# Patient Record
Sex: Female | Born: 1960 | Race: Black or African American | Hispanic: No | Marital: Single | Smoking: Former smoker
Health system: Southern US, Community
[De-identification: ages and names within clinical notes are randomized; demographics above are authoritative.]

## PROBLEM LIST (undated history)

## (undated) DIAGNOSIS — I10 Essential (primary) hypertension: Secondary | ICD-10-CM

## (undated) DIAGNOSIS — F329 Major depressive disorder, single episode, unspecified: Secondary | ICD-10-CM

## (undated) DIAGNOSIS — F32A Depression, unspecified: Secondary | ICD-10-CM

## (undated) DIAGNOSIS — I2510 Atherosclerotic heart disease of native coronary artery without angina pectoris: Secondary | ICD-10-CM

## (undated) DIAGNOSIS — J449 Chronic obstructive pulmonary disease, unspecified: Secondary | ICD-10-CM

## (undated) DIAGNOSIS — J45909 Unspecified asthma, uncomplicated: Secondary | ICD-10-CM

## (undated) DIAGNOSIS — I509 Heart failure, unspecified: Secondary | ICD-10-CM

## (undated) DIAGNOSIS — I208 Other forms of angina pectoris: Secondary | ICD-10-CM

## (undated) DIAGNOSIS — C801 Malignant (primary) neoplasm, unspecified: Secondary | ICD-10-CM

## (undated) HISTORY — PX: COLONOSCOPY: SHX174

## (undated) HISTORY — PX: CHOLECYSTECTOMY: SHX55

---

## 2006-05-22 ENCOUNTER — Emergency Department (HOSPITAL_COMMUNITY): Admission: EM | Admit: 2006-05-22 | Discharge: 2006-05-22 | Payer: Self-pay | Admitting: Emergency Medicine

## 2007-11-01 ENCOUNTER — Emergency Department (HOSPITAL_COMMUNITY): Admission: EM | Admit: 2007-11-01 | Discharge: 2007-11-01 | Payer: Self-pay | Admitting: Family Medicine

## 2007-11-17 ENCOUNTER — Emergency Department (HOSPITAL_COMMUNITY): Admission: EM | Admit: 2007-11-17 | Discharge: 2007-11-17 | Payer: Self-pay | Admitting: Emergency Medicine

## 2008-01-28 ENCOUNTER — Ambulatory Visit: Payer: Self-pay | Admitting: Internal Medicine

## 2008-02-11 ENCOUNTER — Emergency Department (HOSPITAL_COMMUNITY): Admission: EM | Admit: 2008-02-11 | Discharge: 2008-02-11 | Payer: Self-pay | Admitting: Emergency Medicine

## 2008-02-18 ENCOUNTER — Emergency Department (HOSPITAL_COMMUNITY): Admission: EM | Admit: 2008-02-18 | Discharge: 2008-02-19 | Payer: Self-pay | Admitting: Pulmonary Disease

## 2008-03-16 ENCOUNTER — Ambulatory Visit: Payer: Self-pay | Admitting: Internal Medicine

## 2008-03-16 LAB — CONVERTED CEMR LAB
ALT: 33 units/L (ref 0–35)
CO2: 22 meq/L (ref 19–32)
Calcium: 9.5 mg/dL (ref 8.4–10.5)
Chloride: 105 meq/L (ref 96–112)
Creatinine, Ser: 0.52 mg/dL (ref 0.40–1.20)
Glucose, Bld: 99 mg/dL (ref 70–99)
Total Protein: 6 g/dL (ref 6.0–8.3)

## 2008-03-28 ENCOUNTER — Ambulatory Visit: Payer: Self-pay | Admitting: *Deleted

## 2008-05-05 ENCOUNTER — Encounter: Payer: Self-pay | Admitting: Obstetrics and Gynecology

## 2008-05-05 ENCOUNTER — Other Ambulatory Visit: Admission: RE | Admit: 2008-05-05 | Discharge: 2008-05-05 | Payer: Self-pay | Admitting: Obstetrics and Gynecology

## 2008-05-05 ENCOUNTER — Ambulatory Visit: Payer: Self-pay | Admitting: Obstetrics and Gynecology

## 2008-05-18 ENCOUNTER — Ambulatory Visit (HOSPITAL_COMMUNITY): Admission: RE | Admit: 2008-05-18 | Discharge: 2008-05-18 | Payer: Self-pay | Admitting: Family Medicine

## 2008-05-19 ENCOUNTER — Ambulatory Visit: Payer: Self-pay | Admitting: Obstetrics and Gynecology

## 2008-05-20 ENCOUNTER — Encounter: Admission: RE | Admit: 2008-05-20 | Discharge: 2008-05-20 | Payer: Self-pay | Admitting: Obstetrics and Gynecology

## 2008-06-16 ENCOUNTER — Encounter: Payer: Self-pay | Admitting: Obstetrics & Gynecology

## 2008-06-16 ENCOUNTER — Ambulatory Visit: Payer: Self-pay | Admitting: Obstetrics & Gynecology

## 2008-06-16 ENCOUNTER — Ambulatory Visit (HOSPITAL_COMMUNITY): Admission: RE | Admit: 2008-06-16 | Discharge: 2008-06-16 | Payer: Self-pay | Admitting: Obstetrics & Gynecology

## 2008-07-21 ENCOUNTER — Ambulatory Visit: Payer: Self-pay | Admitting: Obstetrics and Gynecology

## 2008-09-20 ENCOUNTER — Ambulatory Visit: Payer: Self-pay | Admitting: Family Medicine

## 2008-09-20 LAB — CONVERTED CEMR LAB
ALT: 21 units/L (ref 0–35)
Basophils Absolute: 0 10*3/uL (ref 0.0–0.1)
CO2: 19 meq/L (ref 19–32)
Calcium: 8.5 mg/dL (ref 8.4–10.5)
Chloride: 107 meq/L (ref 96–112)
Hemoglobin: 15.2 g/dL — ABNORMAL HIGH (ref 12.0–15.0)
Lymphocytes Relative: 38 % (ref 12–46)
Neutro Abs: 6.2 10*3/uL (ref 1.7–7.7)
Platelets: 270 10*3/uL (ref 150–400)
RDW: 12.7 % (ref 11.5–15.5)
Sodium: 137 meq/L (ref 135–145)
TSH: 1.841 microintl units/mL (ref 0.350–4.50)
Total Protein: 5.8 g/dL — ABNORMAL LOW (ref 6.0–8.3)
Vit D, 1,25-Dihydroxy: 14 — ABNORMAL LOW (ref 30–89)

## 2008-11-16 ENCOUNTER — Encounter: Admission: RE | Admit: 2008-11-16 | Discharge: 2008-11-16 | Payer: Self-pay | Admitting: Family Medicine

## 2008-12-19 ENCOUNTER — Ambulatory Visit: Payer: Self-pay | Admitting: Family Medicine

## 2008-12-21 ENCOUNTER — Ambulatory Visit: Payer: Self-pay | Admitting: Internal Medicine

## 2010-05-06 IMAGING — CT CT ABDOMEN W/ CM
2 of 5 series · 17 of 46 positions shown, 19 images · IV contrast (APPLIED)
Comparison: None

CT ABDOMEN

CLINICAL DATA: Left hip and groin pain, lower abdominal pain,
nausea.

CT ABDOMEN AND PELVIS WITH CONTRAST
TECHNIQUE: Multidetector CT imaging of the abdomen and pelvis was
performed using the standard protocol following bolus
administration of intravenous contrast.
Contrast: 80 ml Umnipaque-IPP

[Series 2: abd/pelv with 5.0 b31f st · axial · 0.79mm/px · z∈[-483,-93]mm · 14 of 88 slices shown, 16 images]
[im 5/88  soft-tissue]
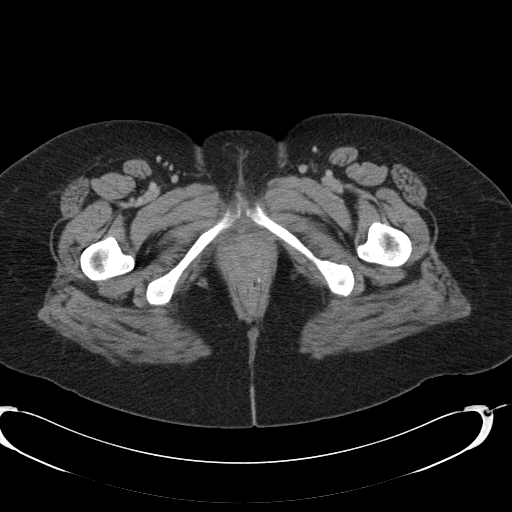
[im 5/88  bone]
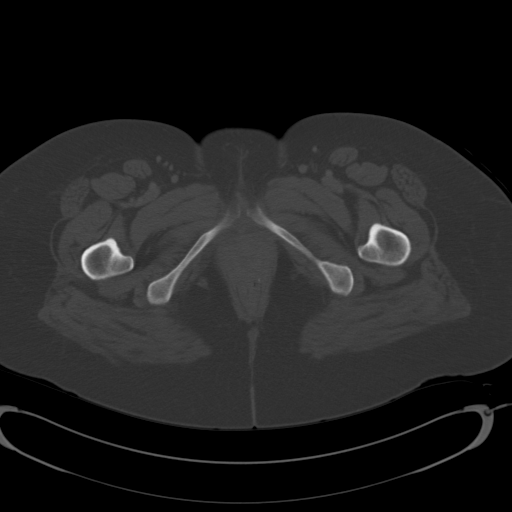
[im 10/88  soft-tissue]
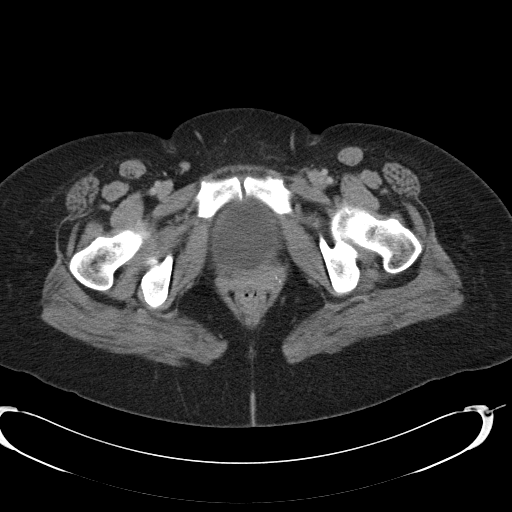
[im 19/88  soft-tissue]
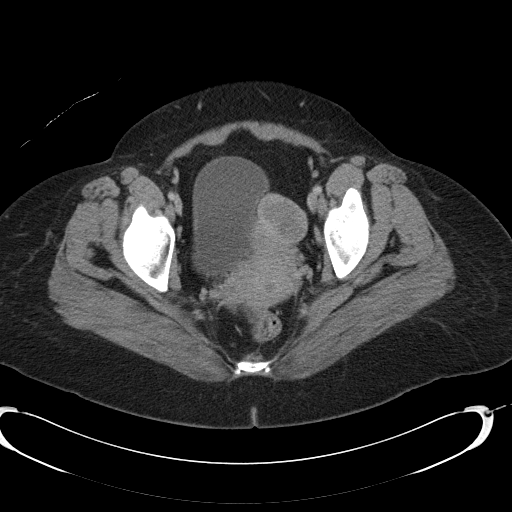
[im 23/88  soft-tissue]
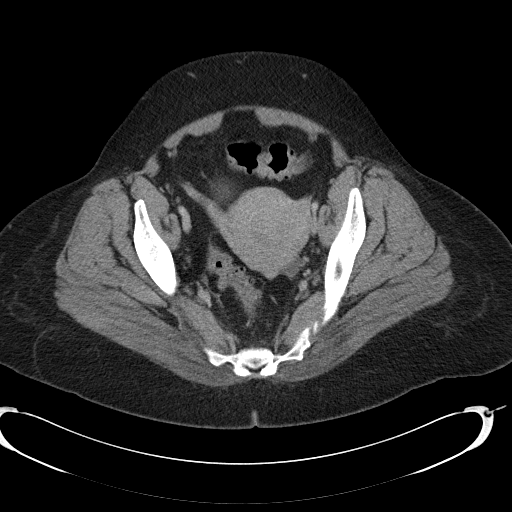
[im 28/88  soft-tissue]
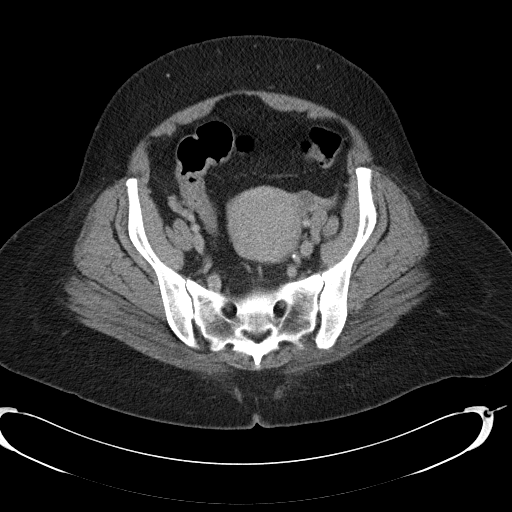
[im 37/88  soft-tissue]
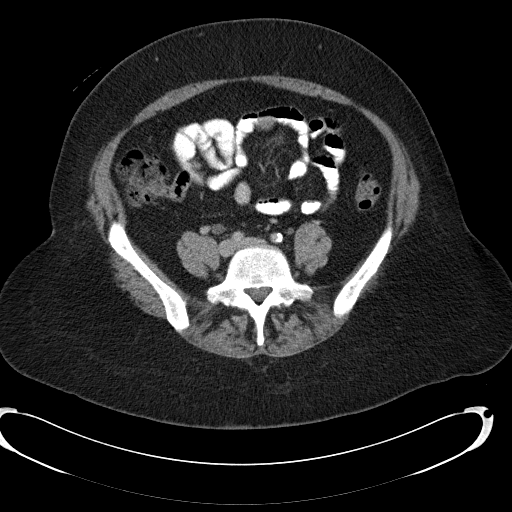
[im 42/88  soft-tissue]
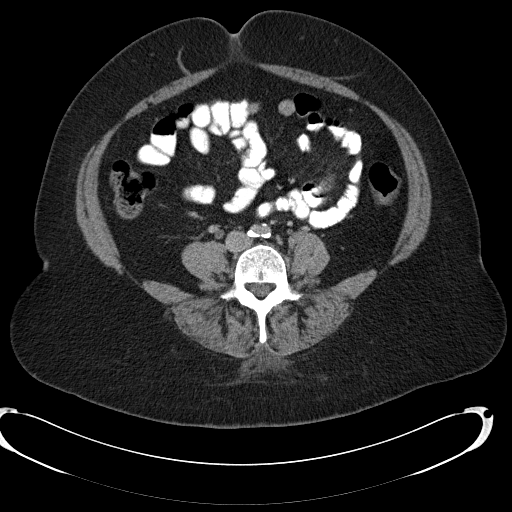
[im 46/88  soft-tissue]
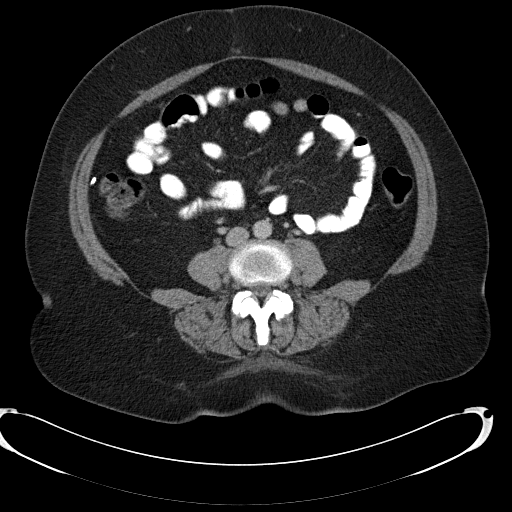
[im 51/88  soft-tissue]
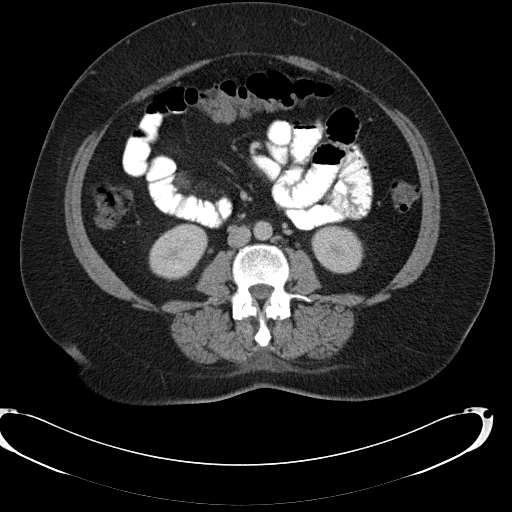
[im 51/88  bone]
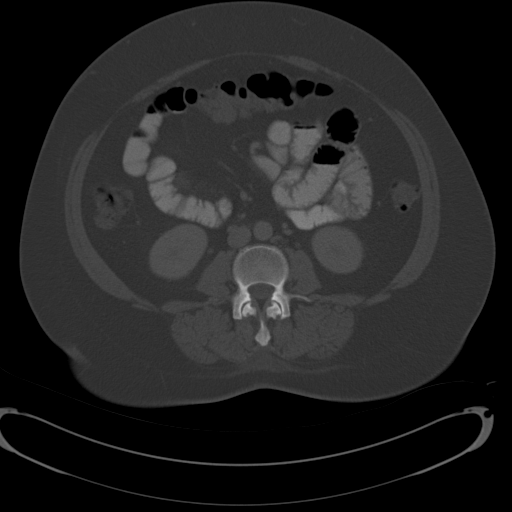
[im 60/88  soft-tissue]
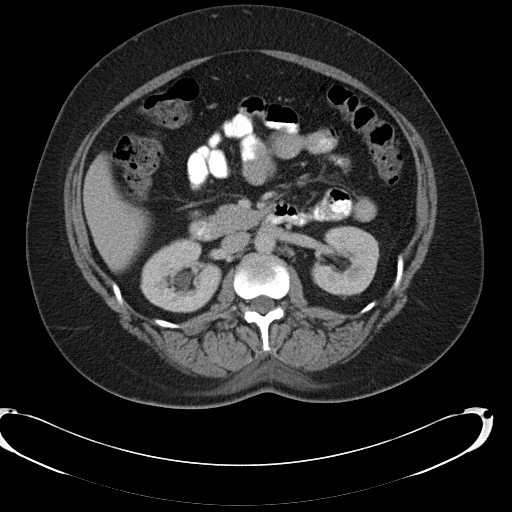
[im 65/88  soft-tissue]
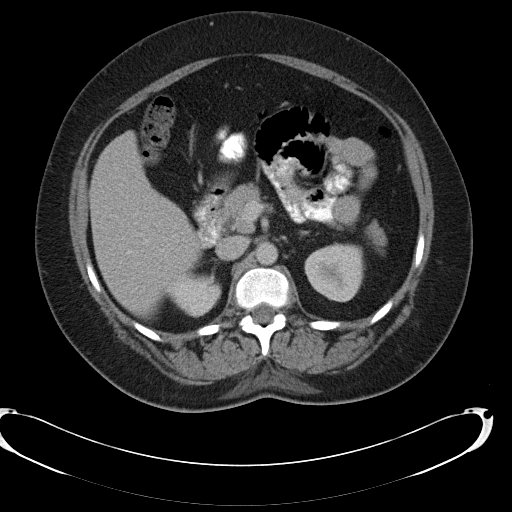
[im 69/88  soft-tissue]
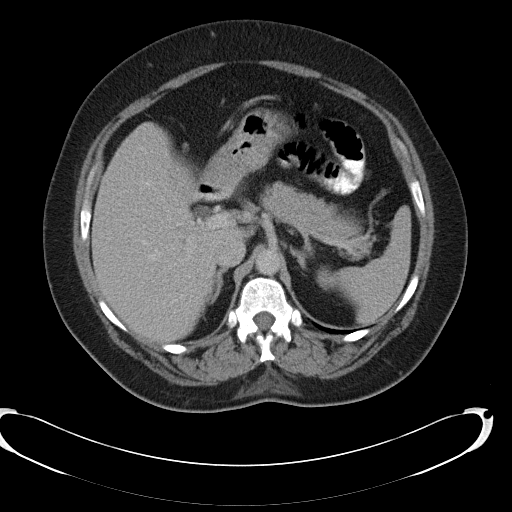
[im 78/88  soft-tissue]
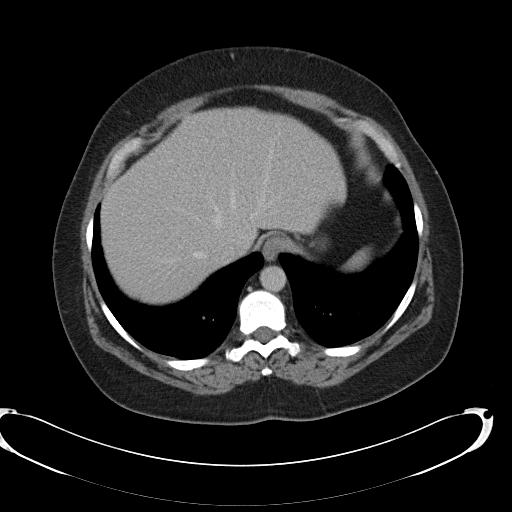
[im 83/88  soft-tissue]
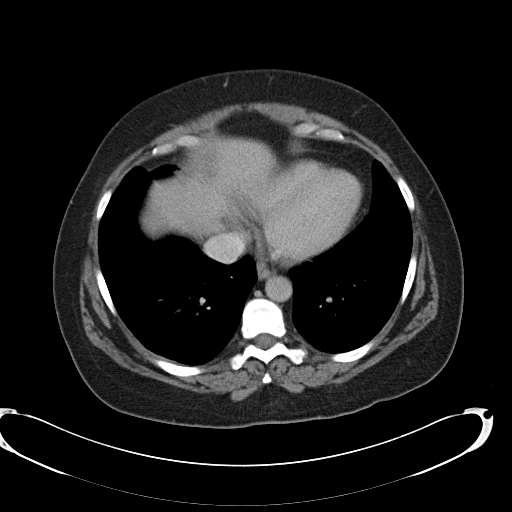

[Series 602: cor · coronal · 0.86mm/px · 3 of 94 slices shown]
[im 32/94  soft-tissue]
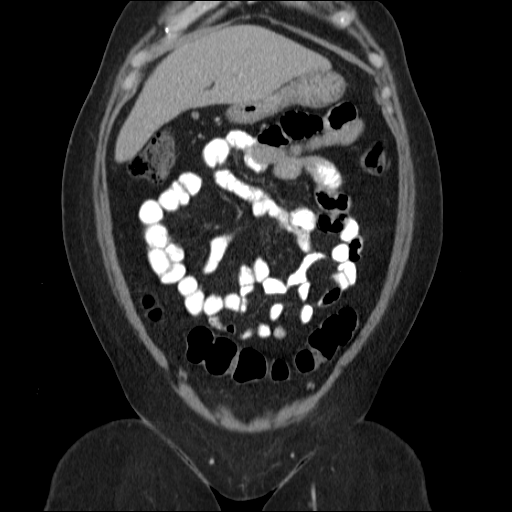
[im 42/94  soft-tissue]
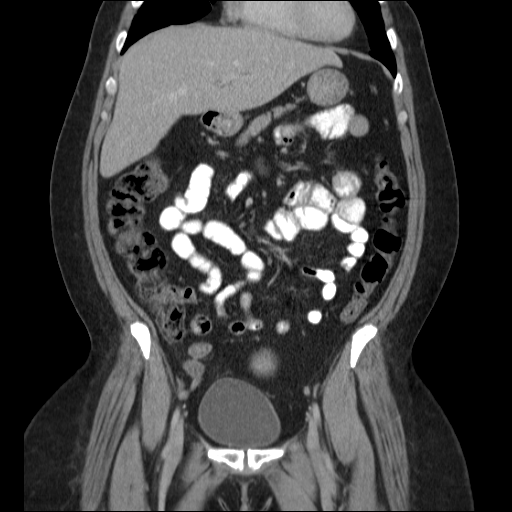
[im 52/94  soft-tissue]
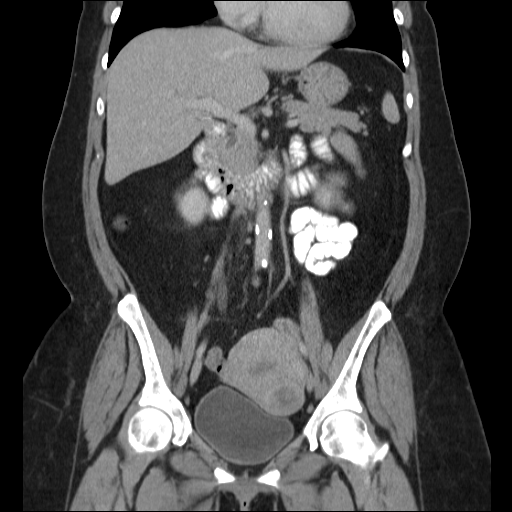

[17 of 46 positions shown; findings below may reference images not displayed]

FINDINGS: The patient is status post cholecystectomy. Liver,
spleen, pancreas, adrenals, kidneys unremarkable. No
hydronephrosis. No biliary ductal dilatation. No renal or proximal
ureteral stones.  Bowel grossly unremarkable.  No free fluid, free
air, or adenopathy. Mild atherosclerotic disease in the aorta
without aneurysm

Lung bases are clear.  No effusions.  Heart is normal size.
IMPRESSION: No stones or hydronephrosis.

Prior cholecystectomy.

CT PELVIS
FINDINGS: No ureteral stones or dilatation.  Small cyst or
follicle within the left ovary.  Small amount of free fluid in the
pelvis, likely physiologic.  Appendix is visualized and is normal.
Pelvic large and small bowel grossly unremarkable.

4.4 cm low density area within the anterior lower uterus, likely
fibroid
IMPRESSION: Fibroid uterus.

Small left ovarian cyst or follicle.

Appendix normal.

Tiny amount of free fluid in the pelvis.

## 2011-01-22 NOTE — Op Note (Signed)
NAMETOLEEN, LACHAPELLE           ACCOUNT NO.:  [id number]   MEDICAL RECORD NO.:  [id number]          PATIENT TYPE:  AMB   LOCATION:  [facility]                           FACILITY:  [facility]   PHYSICIAN:  Allie Bossier, MD        DATE OF BIRTH:  10-Nov-1960   DATE OF PROCEDURE:  06/16/2008  DATE OF DISCHARGE:                               OPERATIVE REPORT   PREOPERATIVE DIAGNOSES:  1. Dysfunctional uterine bleeding.  2. Obesity.   POSTOPERATIVE DIAGNOSES:  1. Dysfunctional uterine bleeding.  2. Obesity.   PROCEDURE:  Dilation and curettage and NovaSure endometrial ablation.   SURGEON:  Myra C. Marice Potter, MD   ANESTHESIA:  LMA, Tyrone Apple. Malen Gauze, MD   COMPLICATIONS:  None.   ESTIMATED BLOOD LOSS:  Minimal.   SPECIMENS:  Uterine curettings.   DETAILS OF PROCEDURE AND FINDINGS:  The risks, benefits, and  alternatives of surgery were explained, understood, and accepted.  Consents were signed.  She was taken to the operating room and placed in  the dorsal lithotomy position.  General anesthesia was applied without  complications.  Her vagina were prepped and draped in usual sterile  fashion.  Bladder was emptied with a latex-free catheter in an in-and-  out fashion.  Bimanual exam revealed an 8-week size uterus, mobile,  anteverted.  Her adnexa were not enlarged.  A speculum was placed.  The  anterior lip of the patulous prolapsing cervix was grasped with single-  tooth tenaculum.  The cervix was easily dilated to accommodate a  NovaSure wand.  The sharp curettage of all quadrants and the fundus of  the uterus yielded a moderate amount of tissue.  This was sent to  pathology for further evaluation.  The NovaSure measuring device was  placed.  The endometrial cavity measured 6 cm.  The cavity length was  6.5 cm, the cavity width was 3.8 cm.  After the test was done, the  machine powered up to 136 and ran for 78 seconds.  At the completion of  the case, the  wand was removed.  The mesh array was  intact with charred remnants of  the endometrium on it.  The tenaculum was removed.  No bleeding was  noted from either the os or the tenaculum site.  She was extubated and  taken to recovery room in stable condition.  The instruments, sponge,  and needle counts were correct.      Allie Bossier, MD  Electronically Signed     MCD/MEDQ  D:  06/16/2008  T:  06/17/2008  Job:  [id number]

## 2011-01-22 NOTE — Group Therapy Note (Signed)
NAME:  Leah Griffith, Leah Griffith           ACCOUNT NO.:  [id number]   MEDICAL RECORD NO.:  [id number]          PATIENT TYPE:  WOC   LOCATION:  [facility]                   FACILITY:  [facility]   PHYSICIAN:  Argentina Donovan, MD        DATE OF BIRTH:  1960/11/25   DATE OF SERVICE:  05/19/2008                                  CLINIC NOTE   The patient is a 50 year old gravida 6, para 3-0-3-3 who has complained  of extremely heavy periods over a long period of time.  She has been  taking iron but has severe cramps with her periods.  On ultrasound the  ovaries are normal.  The uterus shows one small subserosal and two small  intramural fibroids.  The endometrial stripe looked normal.  Endometrial  biopsy was normal and Pap smear was normal.  We have talked to the  patient about endometrial ablation.  She really seems to be in favor of  having that done.  The NovaSure seems a good method.  I have talked to  Dr. Marice Potter about this.  She said she would be happy to do that but wants a  TSH prior to the surgery.  We are going to have her get that in before  she is scheduled and I have discussed the procedure with her and told  her that it does not always work, that she may still have cramping even  after the procedure but she probably will have some less bleeding.  I  told her that she has no reason serious enough for a hysterectomy and  that we cannot cover her with hormones because of the smoking.   IMPRESSION:  Menorrhagia to dysmenorrhea.           ______________________________  Argentina Donovan, MD     PR/MEDQ  D:  05/19/2008  T:  05/20/2008  Job:  [id number]

## 2011-01-22 NOTE — H&P (Signed)
NAME:  VOLLIE, AARON           ACCOUNT NO.:  [id number]   MEDICAL RECORD NO.:  [id number]          PATIENT TYPE:  AMB   LOCATION:  [facility]                           FACILITY:  [facility]   PHYSICIAN:  Allie Bossier, MD        DATE OF BIRTH:  1961-09-01   DATE OF ADMISSION:  06/16/2008  DATE OF DISCHARGE:                              HISTORY & PHYSICAL   Leah Griffith is a 50 year old single black gravida 6, para 3, abortus 3 who  complained of extremely heavy periods for the last 23 years, since the  delivery of her last child.  She has been taking iron, but also  complains of cramping with her periods.  On ultrasound, the uterus has a  3.9 cm small subserosal fibroid and 2 small intramural fibroids.  Endometrial stripe appeared normal.  She had an endometrial biopsy that  was normal on a Pap smear that was normal.  Endometrial ablation was  discussed with the patient by Dr. Okey Dupre and this was discussed with me.  I would like a TSH prior to surgery.  I discussed with her that cramping  may still be present after the procedure, but she will probably have  less bleeding.   PAST MEDICAL HISTORY:  Obesity, asthma, dysfunctional uterine bleeding,  fibroids.   PAST SURGICAL HISTORY:  Tubal ligation, cholecystectomy, and lymph node  excision in her groin.   MEDICATIONS:  She takes iron and she recently stopped Seldane (1 month  ago), Latex.   DRUG ALLERGY:  CODEINE.   SOCIAL HISTORY:  She has a 30-pack-year history of smoking, social  alcohol.  She denies drug use.   REVIEW OF SYSTEMS:  She is sexually active recently and uses condoms in  addition to her tubal ligation.   PHYSICAL EXAMINATION:  VITAL SIGNS:  Stable, afebrile.  GENERAL: Well-nourished, well-hydrated female in no apparent distress.  HEENT: Normal.  HEART:  Regular rate and rhythm.  LUNGS: Clear to auscultation bilaterally.  ABDOMEN: Benign.  EXTERNAL GENITALIA: Normal.  Uterus 6 weeks size, anteverted, mobile.  Adnexa  nontender, no masses.   Hemoglobin of 13.8.   ASSESSMENT AND PLAN:  Proceed dysfunctional uterine bleeding.  We will  proceed with a NovaSure endometrial ablation after a D&C.  Risks of  surgery explained, understood, and accepted.     Allie Bossier, MD  Electronically Signed    MCD/MEDQ  D:  06/16/2008  T:  06/17/2008  Job:  [id number]

## 2011-06-06 LAB — CBC
MCHC: 34.2
MCHC: 34.7
MCV: 101.6 — ABNORMAL HIGH
Platelets: 226
RBC: 3.28 — ABNORMAL LOW
RDW: 13.3

## 2011-06-06 LAB — COMPREHENSIVE METABOLIC PANEL
ALT: 20
AST: 20
Calcium: 8.3 — ABNORMAL LOW
Glucose, Bld: 113 — ABNORMAL HIGH
Sodium: 135
Total Protein: 4.9 — ABNORMAL LOW

## 2011-06-06 LAB — URINALYSIS, ROUTINE W REFLEX MICROSCOPIC
Bilirubin Urine: NEGATIVE
Bilirubin Urine: NEGATIVE
Ketones, ur: NEGATIVE
Nitrite: NEGATIVE
Nitrite: NEGATIVE
Protein, ur: NEGATIVE
Specific Gravity, Urine: 1.017
pH: 5

## 2011-06-06 LAB — LIPASE, BLOOD: Lipase: 22

## 2011-06-06 LAB — WET PREP, GENITAL: Clue Cells Wet Prep HPF POC: NONE SEEN

## 2011-06-06 LAB — DIFFERENTIAL
Basophils Absolute: 0
Basophils Relative: 0
Eosinophils Relative: 1
Lymphocytes Relative: 35
Lymphocytes Relative: 36
Lymphs Abs: 3.8
Monocytes Relative: 6
Neutro Abs: 6
Neutro Abs: 6.1
Neutrophils Relative %: 55

## 2011-06-06 LAB — POCT I-STAT, CHEM 8
BUN: 11
Chloride: 105
HCT: 36
Potassium: 3.7

## 2011-06-06 LAB — PREGNANCY, URINE: Preg Test, Ur: NEGATIVE

## 2011-06-06 LAB — GC/CHLAMYDIA PROBE AMP, GENITAL

## 2011-06-06 LAB — URINE MICROSCOPIC-ADD ON

## 2011-06-11 LAB — URINALYSIS, ROUTINE W REFLEX MICROSCOPIC
Bilirubin Urine: NEGATIVE
Glucose, UA: NEGATIVE
Hgb urine dipstick: NEGATIVE
Specific Gravity, Urine: <1.005 — ABNORMAL LOW
pH: 5

## 2011-06-11 LAB — CBC
HCT: 41.3
Hemoglobin: 13.8
WBC: 13.1 — ABNORMAL HIGH

## 2015-02-19 ENCOUNTER — Encounter (HOSPITAL_COMMUNITY): Payer: Self-pay

## 2015-02-19 ENCOUNTER — Emergency Department (HOSPITAL_COMMUNITY)
Admission: EM | Admit: 2015-02-19 | Discharge: 2015-02-19 | Discharge status: Against Medical Advice | Payer: Self-pay | Attending: Emergency Medicine | Admitting: Emergency Medicine

## 2015-02-19 DIAGNOSIS — I10 Essential (primary) hypertension: Secondary | ICD-10-CM | POA: Insufficient documentation

## 2015-02-19 DIAGNOSIS — J45909 Unspecified asthma, uncomplicated: Secondary | ICD-10-CM | POA: Insufficient documentation

## 2015-02-19 DIAGNOSIS — I25119 Atherosclerotic heart disease of native coronary artery with unspecified angina pectoris: Secondary | ICD-10-CM | POA: Insufficient documentation

## 2015-02-19 DIAGNOSIS — J449 Chronic obstructive pulmonary disease, unspecified: Secondary | ICD-10-CM | POA: Insufficient documentation

## 2015-02-19 DIAGNOSIS — I509 Heart failure, unspecified: Secondary | ICD-10-CM | POA: Insufficient documentation

## 2015-02-19 DIAGNOSIS — R05 Cough: Secondary | ICD-10-CM | POA: Insufficient documentation

## 2015-02-19 HISTORY — DX: Depression, unspecified: F32.A

## 2015-02-19 HISTORY — DX: Essential (primary) hypertension: I10

## 2015-02-19 HISTORY — DX: Chronic obstructive pulmonary disease, unspecified: J44.9

## 2015-02-19 HISTORY — DX: Major depressive disorder, single episode, unspecified: F32.9

## 2015-02-19 HISTORY — DX: Malignant (primary) neoplasm, unspecified: C80.1

## 2015-02-19 HISTORY — DX: Unspecified asthma, uncomplicated: J45.909

## 2015-02-19 HISTORY — DX: Other forms of angina pectoris: I20.8

## 2015-02-19 HISTORY — DX: Heart failure, unspecified: I50.9

## 2015-02-19 HISTORY — DX: Atherosclerotic heart disease of native coronary artery without angina pectoris: I25.10

## 2015-02-19 NOTE — ED Notes (Signed)
Per EMS- Patient states she got choked on a pill about 4.5 hours ago and is still coughing and throat is irritated. Patient has blood-tinged sputum.

## 2015-02-19 NOTE — ED Notes (Signed)
Pt called x2 no response

## 2015-02-19 NOTE — ED Notes (Signed)
Pt called in lobby, no response 

## 2015-02-19 NOTE — ED Notes (Signed)
Pt called from lobby to go to a room, no reponse
